# Patient Record
Sex: Female | Born: 1950 | Race: Black or African American | Hispanic: No | Marital: Married | Smoking: Never smoker
Health system: Southern US, Community
[De-identification: ages and names within clinical notes are randomized; demographics above are authoritative.]

## PROBLEM LIST (undated history)

## (undated) HISTORY — PX: TUBAL LIGATION: SHX77

## (undated) HISTORY — PX: WISDOM TOOTH EXTRACTION: SHX21

---

## 2015-02-10 ENCOUNTER — Emergency Department (HOSPITAL_BASED_OUTPATIENT_CLINIC_OR_DEPARTMENT_OTHER)
Admission: EM | Admit: 2015-02-10 | Discharge: 2015-02-10 | Disposition: A | Discharge status: Home / Self Care | Payer: BLUE CROSS/BLUE SHIELD | Attending: Physician Assistant | Admitting: Physician Assistant

## 2015-02-10 ENCOUNTER — Encounter (HOSPITAL_BASED_OUTPATIENT_CLINIC_OR_DEPARTMENT_OTHER): Payer: Self-pay | Admitting: *Deleted

## 2015-02-10 DIAGNOSIS — M549 Dorsalgia, unspecified: Secondary | ICD-10-CM | POA: Diagnosis not present

## 2015-02-10 DIAGNOSIS — X58XXXA Exposure to other specified factors, initial encounter: Secondary | ICD-10-CM | POA: Diagnosis not present

## 2015-02-10 DIAGNOSIS — S46911A Strain of unspecified muscle, fascia and tendon at shoulder and upper arm level, right arm, initial encounter: Secondary | ICD-10-CM | POA: Insufficient documentation

## 2015-02-10 DIAGNOSIS — Y9289 Other specified places as the place of occurrence of the external cause: Secondary | ICD-10-CM | POA: Insufficient documentation

## 2015-02-10 DIAGNOSIS — Y9389 Activity, other specified: Secondary | ICD-10-CM | POA: Diagnosis not present

## 2015-02-10 DIAGNOSIS — Y998 Other external cause status: Secondary | ICD-10-CM | POA: Diagnosis not present

## 2015-02-10 DIAGNOSIS — S46811A Strain of other muscles, fascia and tendons at shoulder and upper arm level, right arm, initial encounter: Secondary | ICD-10-CM

## 2015-02-10 DIAGNOSIS — S4991XA Unspecified injury of right shoulder and upper arm, initial encounter: Secondary | ICD-10-CM | POA: Diagnosis present

## 2015-02-10 LAB — URINALYSIS, ROUTINE W REFLEX MICROSCOPIC
Bilirubin Urine: NEGATIVE
GLUCOSE, UA: NEGATIVE mg/dL
KETONES UR: NEGATIVE mg/dL
LEUKOCYTES UA: NEGATIVE
NITRITE: NEGATIVE
PROTEIN: NEGATIVE mg/dL
Specific Gravity, Urine: 1.012 (ref 1.005–1.030)
UROBILINOGEN UA: 0.2 mg/dL (ref 0.0–1.0)
pH: 5.5 (ref 5.0–8.0)

## 2015-02-10 LAB — URINE MICROSCOPIC-ADD ON

## 2015-02-10 MED ORDER — IBUPROFEN 800 MG PO TABS: 800.0000 mg | ORAL_TABLET | Freq: Three times a day (TID) | ORAL | Status: AC

## 2015-02-10 MED ORDER — CYCLOBENZAPRINE HCL 10 MG PO TABS: 10.0000 mg | ORAL_TABLET | Freq: Two times a day (BID) | ORAL | Status: AC | PRN

## 2015-02-10 NOTE — ED Provider Notes (Signed)
CSN: [id number]     Arrival date & time 02/10/15  1609 History   By signing my name below, I, Terrance Branch, attest that this documentation has been prepared under the direction and in the presence of Renia Mikelson Randall An, MD. Electronically Signed: Evon Slack, ED Scribe. 02/10/2015. 6:48 PM.     Chief Complaint  Patient presents with  . Back Pain   Patient is a 64 y.o. female presenting with back pain. The history is provided by the patient. No language interpreter was used.  Back Pain Associated symptoms: no chest pain, no numbness and no weakness    HPI Comments: Tammy Murphy is a 64 y.o. female who presents to the Emergency Department complaining of right sided back pain. Pt states that the pain begins in her shoulder and radiated down to her mid back. Pt states tat that the pain is worse when moving her right arm. Pt state she has tried ibuprofen that only provides temporary relief. Pt denies injury or recent fall. Pt denies nausea, vomiting, numbness, weakness, or CP.    History reviewed. No pertinent past medical history. Past Surgical History  Procedure Laterality Date  . Wisdom tooth extraction    . Tubal ligation     No family history on file. Social History  Substance Use Topics  . Smoking status: Never Smoker   . Smokeless tobacco: Never Used  . Alcohol Use: No   OB History    No data available     Review of Systems  Cardiovascular: Negative for chest pain.  Gastrointestinal: Negative for nausea and vomiting.  Genitourinary: Negative.   Musculoskeletal: Positive for back pain.  Neurological: Negative for weakness and numbness.  All other systems reviewed and are negative.    Allergies  Review of patient's allergies indicates no known allergies.  Home Medications   Prior to Admission medications   Not on File   BP 141/67 mmHg  Pulse 84  Temp(Src) 97.9 F (36.6 C) (Oral)  Resp 18  Ht  (1.651 m)  Wt 192 lb (87.091 kg)  BMI 31.95  kg/m2  SpO2 99%   Physical Exam  Constitutional: She is oriented to person, place, and time. She appears well-developed and well-nourished. No distress.  HENT:  Head: Normocephalic and atraumatic.  Eyes: Conjunctivae and EOM are normal.  Neck: Neck supple. No tracheal deviation present.  Cardiovascular: Normal rate.   Pulmonary/Chest: Effort normal. No respiratory distress.  Musculoskeletal: Normal range of motion.  tenderness of right trapezius and right lateral back worse with movement.   Neurological: She is alert and oriented to person, place, and time.  Skin: Skin is warm and dry.  Psychiatric: She has a normal mood and affect. Her behavior is normal.  Nursing note and vitals reviewed.   ED Course  Procedures (including critical care time) DIAGNOSTIC STUDIES: Oxygen Saturation is 99% on RA, normal by my interpretation.    COORDINATION OF CARE: 7:16 PM-Discussed treatment plan with pt at bedside and pt agreed to plan.     Labs Review Labs Reviewed - No data to display  Imaging Review No results found.    EKG Interpretation None      MDM   Final diagnoses:  None   64 year old female presenting with pain in her right trapezius. She's got worsening pain there that radiates down to her back when she moves her right arm. I suspect this is musculoskeletal.Tenderness to palpation.  She had no chest pain other symptoms. Given the fact it is  worse with movement she has normal neurologic exam  We'll discharge her ibuprofen Flexeril have her follow-up with regular physician.  I personally performed the services described in this documentation, which was scribed in my presence. The recorded information has been reviewed and is accurate.      Wray Goehring Randall An, MD 02/10/15 1926

## 2015-02-10 NOTE — ED Notes (Signed)
Right side back pain x 4 days- denies other Sx- pain worse with movement

## 2018-03-05 ENCOUNTER — Emergency Department (HOSPITAL_BASED_OUTPATIENT_CLINIC_OR_DEPARTMENT_OTHER): Payer: Medicare Other

## 2018-03-05 ENCOUNTER — Encounter (HOSPITAL_BASED_OUTPATIENT_CLINIC_OR_DEPARTMENT_OTHER): Payer: Self-pay | Admitting: Emergency Medicine

## 2018-03-05 ENCOUNTER — Emergency Department (HOSPITAL_BASED_OUTPATIENT_CLINIC_OR_DEPARTMENT_OTHER)
Admission: EM | Admit: 2018-03-05 | Discharge: 2018-03-05 | Disposition: A | Discharge status: Home / Self Care | Payer: Medicare Other | Attending: Emergency Medicine | Admitting: Emergency Medicine

## 2018-03-05 ENCOUNTER — Other Ambulatory Visit: Payer: Self-pay

## 2018-03-05 DIAGNOSIS — Z79899 Other long term (current) drug therapy: Secondary | ICD-10-CM | POA: Insufficient documentation

## 2018-03-05 DIAGNOSIS — H81399 Other peripheral vertigo, unspecified ear: Secondary | ICD-10-CM | POA: Diagnosis not present

## 2018-03-05 DIAGNOSIS — R42 Dizziness and giddiness: Secondary | ICD-10-CM | POA: Diagnosis present

## 2018-03-05 LAB — URINALYSIS, MICROSCOPIC (REFLEX)

## 2018-03-05 LAB — CBC WITH DIFFERENTIAL/PLATELET
Abs Immature Granulocytes: 0.01 10*3/uL (ref 0.00–0.07)
BASOS ABS: 0 10*3/uL (ref 0.0–0.1)
Basophils Relative: 0 %
Eosinophils Absolute: 0.1 10*3/uL (ref 0.0–0.5)
Eosinophils Relative: 2 %
HCT: 43.6 % (ref 36.0–46.0)
HEMOGLOBIN: 14.2 g/dL (ref 12.0–15.0)
IMMATURE GRANULOCYTES: 0 %
LYMPHS PCT: 48 %
Lymphs Abs: 2.5 10*3/uL (ref 0.7–4.0)
MCH: 28.3 pg (ref 26.0–34.0)
MCHC: 32.6 g/dL (ref 30.0–36.0)
MCV: 86.9 fL (ref 80.0–100.0)
Monocytes Absolute: 0.5 10*3/uL (ref 0.1–1.0)
Monocytes Relative: 10 %
Neutro Abs: 2.1 10*3/uL (ref 1.7–7.7)
Neutrophils Relative %: 40 %
Platelets: 328 10*3/uL (ref 150–400)
RBC: 5.02 MIL/uL (ref 3.87–5.11)
RDW: 14.5 % (ref 11.5–15.5)
WBC: 5.2 10*3/uL (ref 4.0–10.5)
nRBC: 0 % (ref 0.0–0.2)

## 2018-03-05 LAB — COMPREHENSIVE METABOLIC PANEL
ALT: 34 U/L (ref 0–44)
AST: 27 U/L (ref 15–41)
Albumin: 4 g/dL (ref 3.5–5.0)
Alkaline Phosphatase: 68 U/L (ref 38–126)
Anion gap: 11 (ref 5–15)
BUN: 16 mg/dL (ref 8–23)
CHLORIDE: 103 mmol/L (ref 98–111)
CO2: 26 mmol/L (ref 22–32)
Calcium: 9.8 mg/dL (ref 8.9–10.3)
Creatinine, Ser: 0.92 mg/dL (ref 0.44–1.00)
GFR calc Af Amer: >60 mL/min (ref >60)
Glucose, Bld: 101 mg/dL — ABNORMAL HIGH (ref 70–99)
POTASSIUM: 4.3 mmol/L (ref 3.5–5.1)
SODIUM: 140 mmol/L (ref 135–145)
Total Bilirubin: 0.6 mg/dL (ref 0.3–1.2)
Total Protein: 7.7 g/dL (ref 6.5–8.1)

## 2018-03-05 LAB — TROPONIN I

## 2018-03-05 LAB — URINALYSIS, ROUTINE W REFLEX MICROSCOPIC
BILIRUBIN URINE: NEGATIVE
Glucose, UA: NEGATIVE mg/dL
Ketones, ur: NEGATIVE mg/dL
LEUKOCYTES UA: NEGATIVE
Nitrite: NEGATIVE
Protein, ur: NEGATIVE mg/dL
SPECIFIC GRAVITY, URINE: 1.015 (ref 1.005–1.030)
pH: 7 (ref 5.0–8.0)

## 2018-03-05 MED ORDER — MECLIZINE HCL 25 MG PO TABS
25.0000 mg | ORAL_TABLET | Freq: Once | ORAL | Status: AC
  Administered 2018-03-05: 25 mg via ORAL
  Filled 2018-03-05: qty 1

## 2018-03-05 MED ORDER — SODIUM CHLORIDE 0.9 % IV BOLUS
1000.0000 mL | Freq: Once | INTRAVENOUS | Status: AC
  Administered 2018-03-05: 1000 mL via INTRAVENOUS

## 2018-03-05 MED ORDER — MECLIZINE HCL 25 MG PO TABS: 25.0000 mg | ORAL_TABLET | Freq: Three times a day (TID) | ORAL | 0 refills | Status: AC | PRN

## 2018-03-05 MED ORDER — ONDANSETRON HCL 4 MG/2ML IJ SOLN
4.0000 mg | Freq: Once | INTRAMUSCULAR | Status: AC
  Administered 2018-03-05: 4 mg via INTRAVENOUS
  Filled 2018-03-05: qty 2

## 2018-03-05 MED ORDER — ONDANSETRON 4 MG PO TBDP: 4.0000 mg | ORAL_TABLET | Freq: Three times a day (TID) | ORAL | 0 refills | Status: AC | PRN

## 2018-03-05 NOTE — ED Notes (Signed)
Patient is undressing. EKG delayed due to patient.

## 2018-03-05 NOTE — ED Provider Notes (Signed)
[facility] EMERGENCY DEPARTMENT Provider Note   CSN: [id number] Arrival date & time: 03/05/18  1308     History   Chief Complaint Chief Complaint  Patient presents with  . Dizziness    HPI Tammy Murphy is a 67 y.o. female.  Pt presents to the ED today with dizziness.  She said the room has been spinning since waking up at 0700 today.  She has not vomited, but feels nauseous.  The pt said she was fine when she went to bed.  She has had vertigo in the past, but not in a "long time."  The pt said she feels off balance when she walks, but denies any weakness, difficulty swallowing/talking.     History reviewed. No pertinent past medical history.  There are no active problems to display for this patient.   Past Surgical History:  Procedure Laterality Date  . TUBAL LIGATION    . WISDOM TOOTH EXTRACTION       OB History   None      Home Medications    Prior to Admission medications   Medication Sig Start Date End Date Taking? Authorizing Provider  cyclobenzaprine (FLEXERIL) 10 MG tablet Take 1 tablet (10 mg total) by mouth 2 (two) times daily as needed for muscle spasms. 02/10/15   Mackuen, Courteney Lyn, MD  ibuprofen (ADVIL,MOTRIN) 800 MG tablet Take 1 tablet (800 mg total) by mouth 3 (three) times daily. 02/10/15   Mackuen, Courteney Lyn, MD  meclizine (ANTIVERT) 25 MG tablet Take 1 tablet (25 mg total) by mouth 3 (three) times daily as needed for dizziness. 03/05/18   Jacalyn Lefevre, MD  ondansetron (ZOFRAN ODT) 4 MG disintegrating tablet Take 1 tablet (4 mg total) by mouth every 8 (eight) hours as needed. 03/05/18   Jacalyn Lefevre, MD    Family History No family history on file.  Social History Social History   Tobacco Use  . Smoking status: Never Smoker  . Smokeless tobacco: Never Used  Substance Use Topics  . Alcohol use: No  . Drug use: No     Allergies   Tramadol   Review of Systems Review of Systems  Gastrointestinal: Positive  for nausea.  Neurological: Positive for dizziness.  All other systems reviewed and are negative.    Physical Exam Updated Vital Signs BP (!) 154/87   Pulse 71   Temp 97.7 F (36.5 C) (Oral)   Resp 17   Ht 5\' 8"  (1.727 m)   Wt 87.1 kg   SpO2 99%   BMI 29.20 kg/m   Physical Exam  Constitutional: She is oriented to person, place, and time. She appears well-developed and well-nourished.  HENT:  Head: Normocephalic and atraumatic.  Right Ear: External ear normal.  Left Ear: External ear normal.  Nose: Nose normal.  Mouth/Throat: Oropharynx is clear and moist.  Eyes: Pupils are equal, round, and reactive to light. Conjunctivae and EOM are normal.  Neck: Normal range of motion. Neck supple.  Cardiovascular: Normal rate, regular rhythm, normal heart sounds and intact distal pulses.  Pulmonary/Chest: Effort normal and breath sounds normal.  Abdominal: Soft. Bowel sounds are normal.  Musculoskeletal: Normal range of motion.  Neurological: She is alert and oriented to person, place, and time.  Skin: Skin is warm. Capillary refill takes less than 2 seconds.  Psychiatric: She has a normal mood and affect. Her behavior is normal. Judgment and thought content normal.  Nursing note and vitals reviewed.    ED Treatments / Results  Labs (all labs ordered are listed, but only abnormal results are displayed) Labs Reviewed  COMPREHENSIVE METABOLIC PANEL - Abnormal; Notable for the following components:      Result Value   Glucose, Bld 101 (*)    All other components within normal limits  URINALYSIS, ROUTINE W REFLEX MICROSCOPIC - Abnormal; Notable for the following components:   Hgb urine dipstick SMALL (*)    All other components within normal limits  URINALYSIS, MICROSCOPIC (REFLEX) - Abnormal; Notable for the following components:   Bacteria, UA RARE (*)    All other components within normal limits  CBC WITH DIFFERENTIAL/PLATELET  TROPONIN I    EKG EKG  Interpretation  Date/Time:  Saturday March 05 2018 13:26:18 EDT Ventricular Rate:  77 PR Interval:    QRS Duration: 92 QT Interval:  384 QTC Calculation: 435 R Axis:   28 Text Interpretation:  Sinus rhythm Abnormal R-wave progression, early transition No old tracing to compare Confirmed by Jacalyn Lefevre [id number]) on 03/05/2018 1:37:32 PM   Radiology Ct Head Wo Contrast  Result Date: 03/05/2018 CLINICAL DATA:  Dizziness and nausea since this morning. EXAM: CT HEAD WITHOUT CONTRAST TECHNIQUE: Contiguous axial images were obtained from the base of the skull through the vertex without intravenous contrast. COMPARISON:  None. FINDINGS: Brain: The ventricles are normal in size and configuration. No extra-axial fluid collections are identified. The gray-white differentiation is maintained. No CT findings for acute hemispheric infarction or intracranial hemorrhage. No mass lesions. The brainstem and cerebellum are normal. Vascular: No hyperdense vessels or obvious aneurysm. Skull: No acute skull fracture.  No bone lesion. Sinuses/Orbits: The paranasal sinuses and mastoid air cells are clear. The globes are intact. Other: No scalp lesions, laceration or hematoma. IMPRESSION: Normal head CT. Electronically Signed   By: Rudie Meyer M.D.   On: 03/05/2018 14:30    Procedures Procedures (including critical care time)  Medications Ordered in ED Medications  sodium chloride 0.9 % bolus 1,000 mL (1,000 mLs Intravenous New Bag/Given 03/05/18 1415)  ondansetron (ZOFRAN) injection 4 mg (4 mg Intravenous Given 03/05/18 1417)  meclizine (ANTIVERT) tablet 25 mg (25 mg Oral Given 03/05/18 1417)     Initial Impression / Assessment and Plan / ED Course  I have reviewed the triage vital signs and the nursing notes.  Pertinent labs & imaging results that were available during my care of the patient were reviewed by me and considered in my medical decision making (see chart for details).    Pt is feeling  much better.  No more dizziness or nausea.  Pt is encouraged to return if worse and to f/u with pcp.  Final Clinical Impressions(s) / ED Diagnoses   Final diagnoses:  Peripheral vertigo, unspecified laterality    ED Discharge Orders         Ordered    meclizine (ANTIVERT) 25 MG tablet  3 times daily PRN     03/05/18 1437    ondansetron (ZOFRAN ODT) 4 MG disintegrating tablet  Every 8 hours PRN     03/05/18 1437           Jacalyn Lefevre, MD 03/05/18 1438

## 2018-03-05 NOTE — ED Triage Notes (Signed)
Pt states she has felt like the room is spinning since waking up at 7 this morning. She felt normal when she went to bed last night. Also endorses nausea.

## 2019-11-12 IMAGING — CT CT HEAD W/O CM
3 series · 15 of 47 positions shown, 18 images · non-contrast
Comparison: None.

CLINICAL DATA: Dizziness and nausea since this morning.

EXAM:
CT HEAD WITHOUT CONTRAST
TECHNIQUE: Contiguous axial images were obtained from the base of the skull
through the vertex without intravenous contrast.

[Series 2: head wo · axial · 0.49mm/px · z∈[-156,-26]mm · 9 of 32 slices shown, 12 images]
[im 3/32  brain]
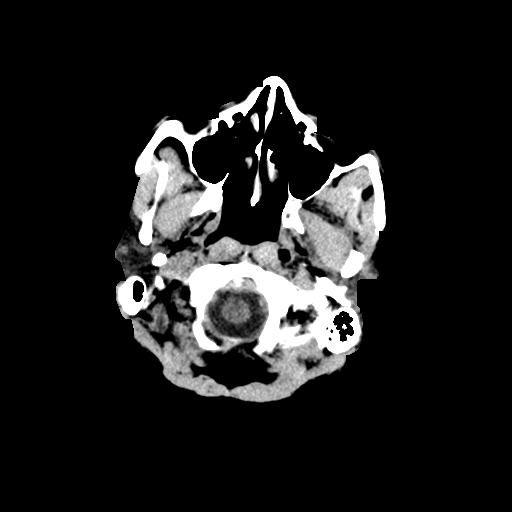
[im 3/32  bone]
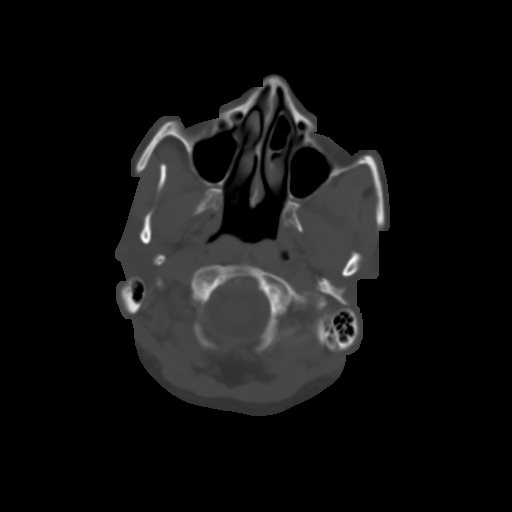
[im 6/32  brain]
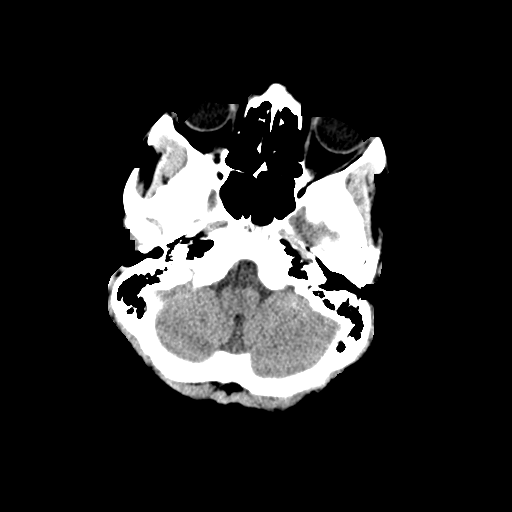
[im 9/32  brain]
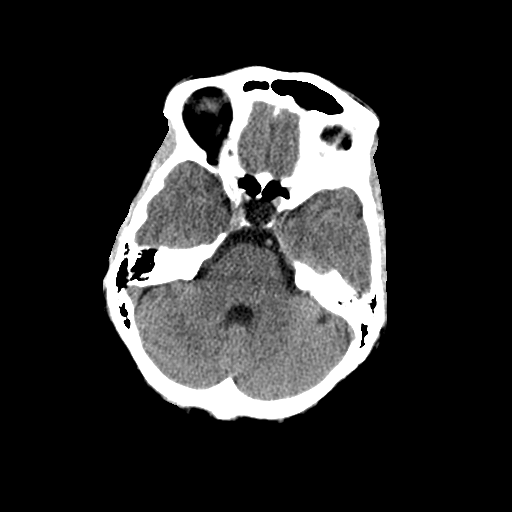
[im 12/32  brain]
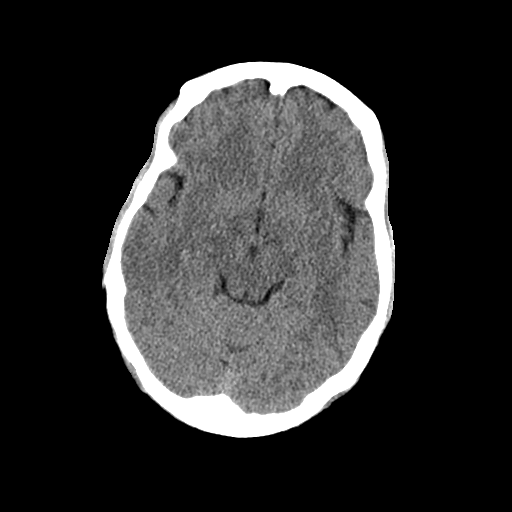
[im 17/32  brain]
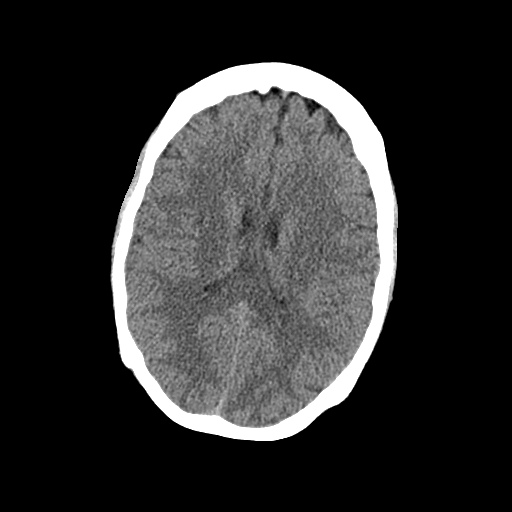
[im 17/32  bone]
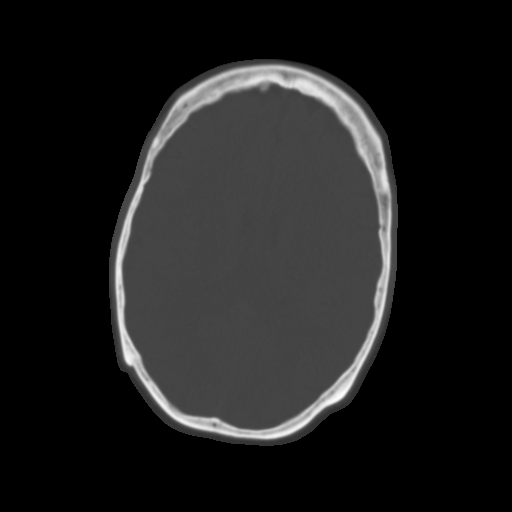
[im 20/32  brain]
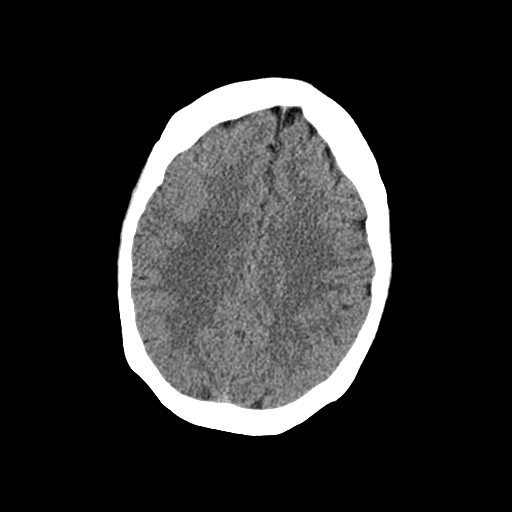
[im 23/32  brain]
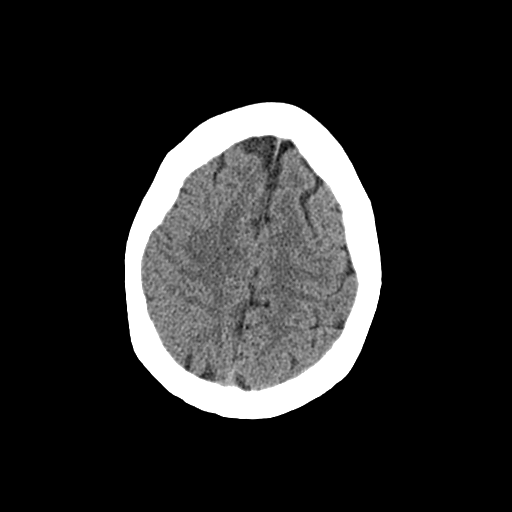
[im 26/32  brain]
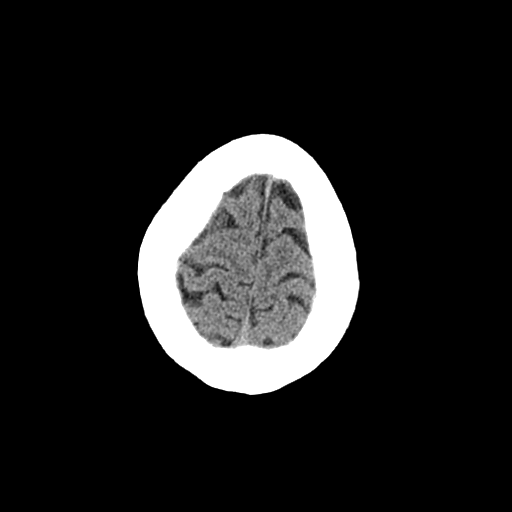
[im 29/32  brain]
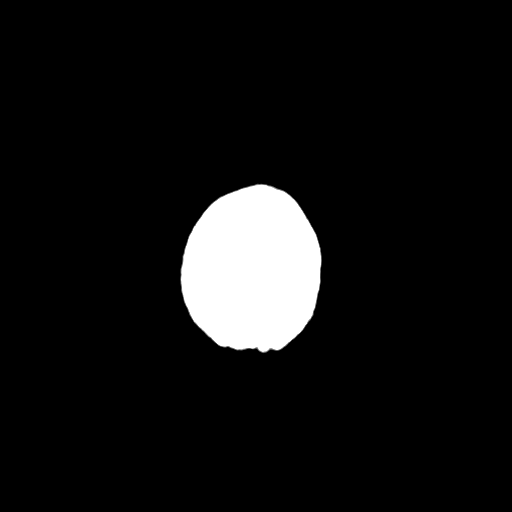
[im 29/32  bone]
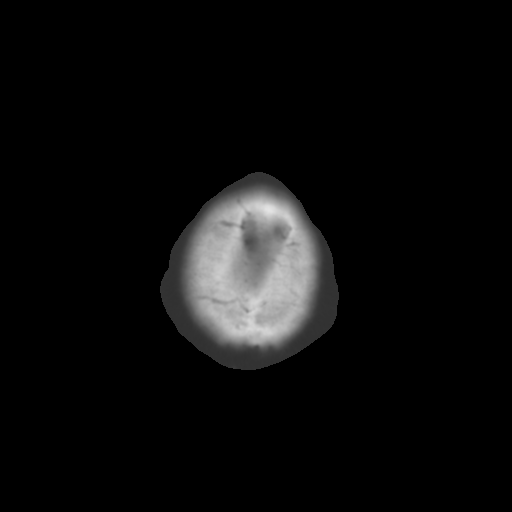

[Series 4: cor soft · coronal · 0.32mm/px · 3 of 64 slices shown]
[im 22/64  brain]
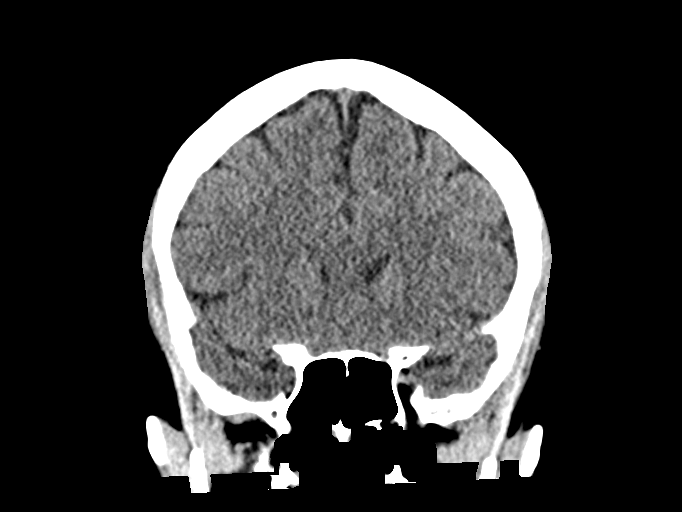
[im 29/64  brain]
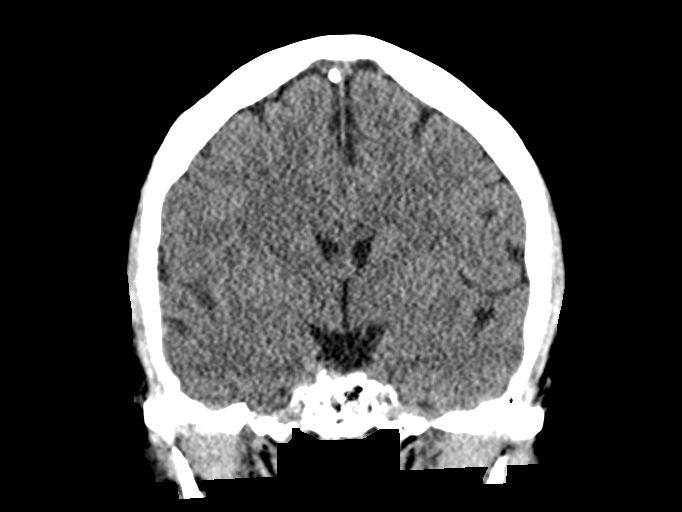
[im 36/64  brain]
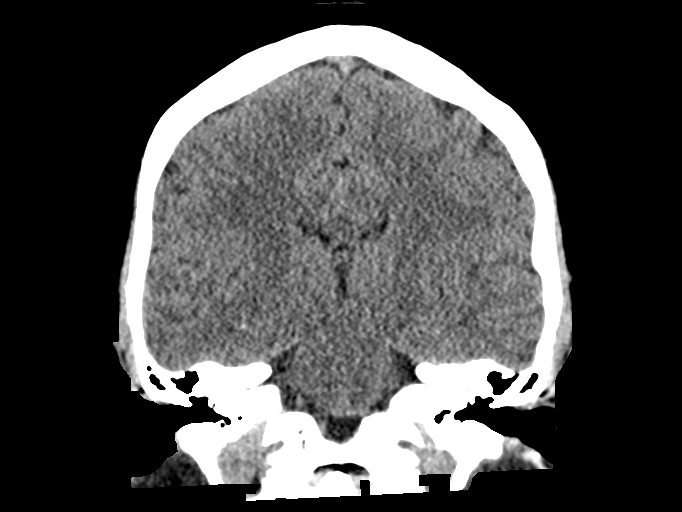

[Series 5: sag soft · sagittal · 0.31mm/px · 3 of 58 slices shown]
[im 20/58  brain]
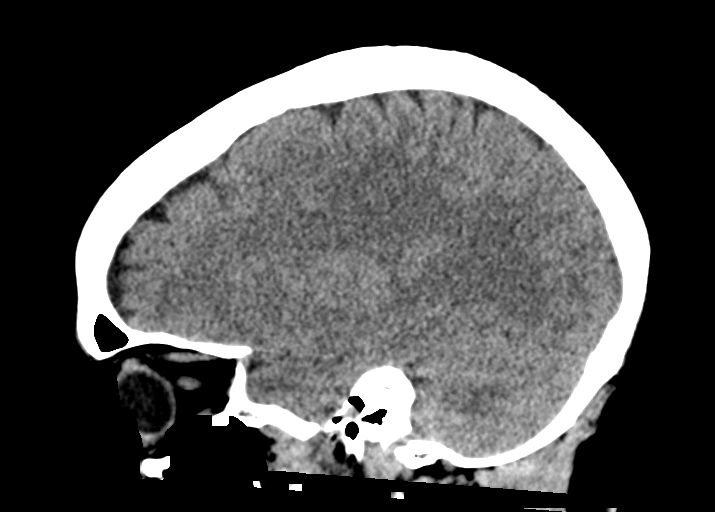
[im 29/58  brain]
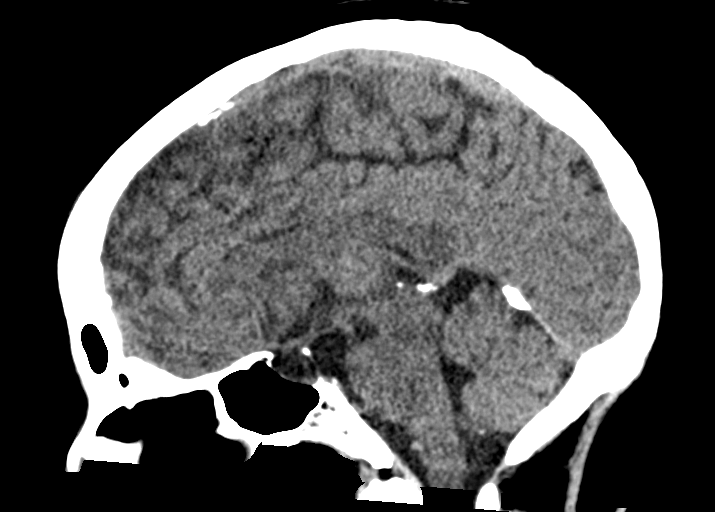
[im 39/58  brain]
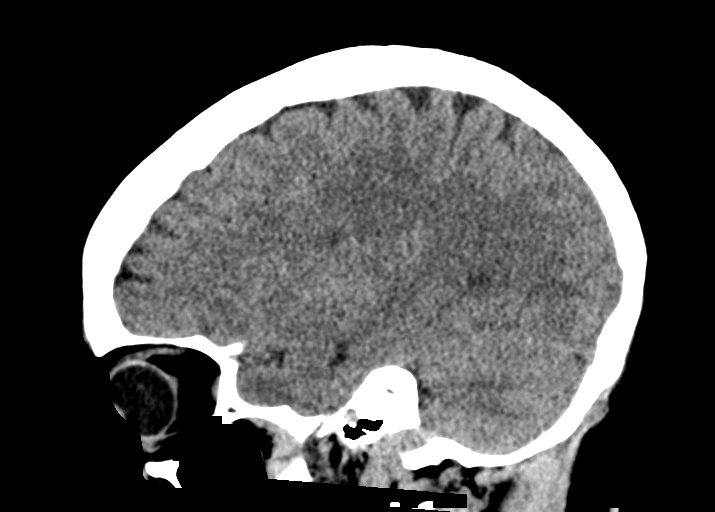

[15 of 47 positions shown; findings below may reference images not displayed]

FINDINGS: Brain: The ventricles are normal in size and configuration. No
extra-axial fluid collections are identified. The gray-white
differentiation is maintained. No CT findings for acute hemispheric
infarction or intracranial hemorrhage. No mass lesions. The
brainstem and cerebellum are normal.

Vascular: No hyperdense vessels or obvious aneurysm.

Skull: No acute skull fracture.  No bone lesion.

Sinuses/Orbits: The paranasal sinuses and mastoid air cells are
clear. The globes are intact.

Other: No scalp lesions, laceration or hematoma.
IMPRESSION: Normal head CT.
